# Patient Record
Sex: Male | Born: 1970 | Race: White | Hispanic: No | Marital: Married
Health system: Southern US, Community
[De-identification: ages and names within clinical notes are randomized; demographics above are authoritative.]

## PROBLEM LIST (undated history)

## (undated) DIAGNOSIS — E119 Type 2 diabetes mellitus without complications: Secondary | ICD-10-CM

## (undated) DIAGNOSIS — E785 Hyperlipidemia, unspecified: Secondary | ICD-10-CM

## (undated) DIAGNOSIS — C801 Malignant (primary) neoplasm, unspecified: Secondary | ICD-10-CM

## (undated) DIAGNOSIS — R519 Headache, unspecified: Secondary | ICD-10-CM

## (undated) DIAGNOSIS — A159 Respiratory tuberculosis unspecified: Secondary | ICD-10-CM

## (undated) DIAGNOSIS — K219 Gastro-esophageal reflux disease without esophagitis: Secondary | ICD-10-CM

## (undated) DIAGNOSIS — I10 Essential (primary) hypertension: Secondary | ICD-10-CM

---

## 2009-06-22 ENCOUNTER — Ambulatory Visit: Payer: Self-pay

## 2011-01-24 IMAGING — CR DG CHEST 2V
1 series · 3 of 3 positions shown · non-contrast
Comparison: none

REASON FOR EXAM: cough
COMMENTS:

[Series 1: view not recorded · 0.17mm/px · 3 of 3 slices shown]
[im 1/3]
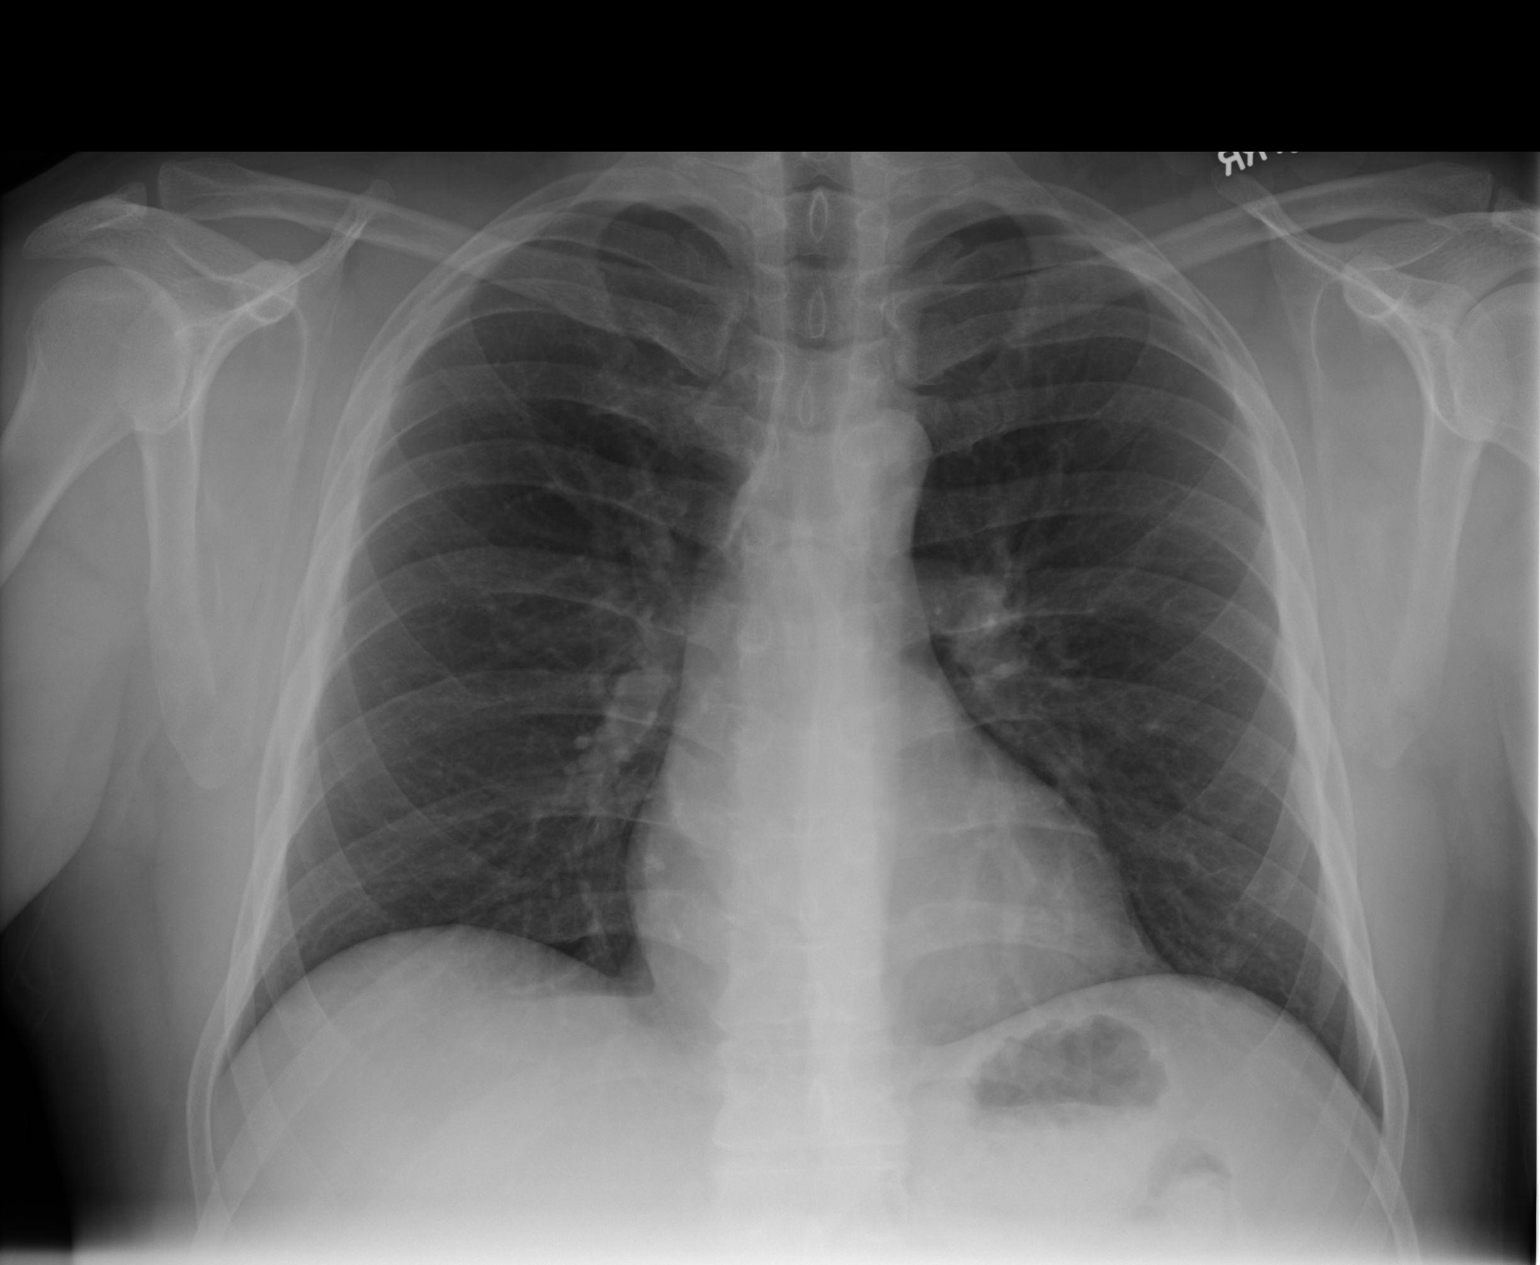
[im 2/3]
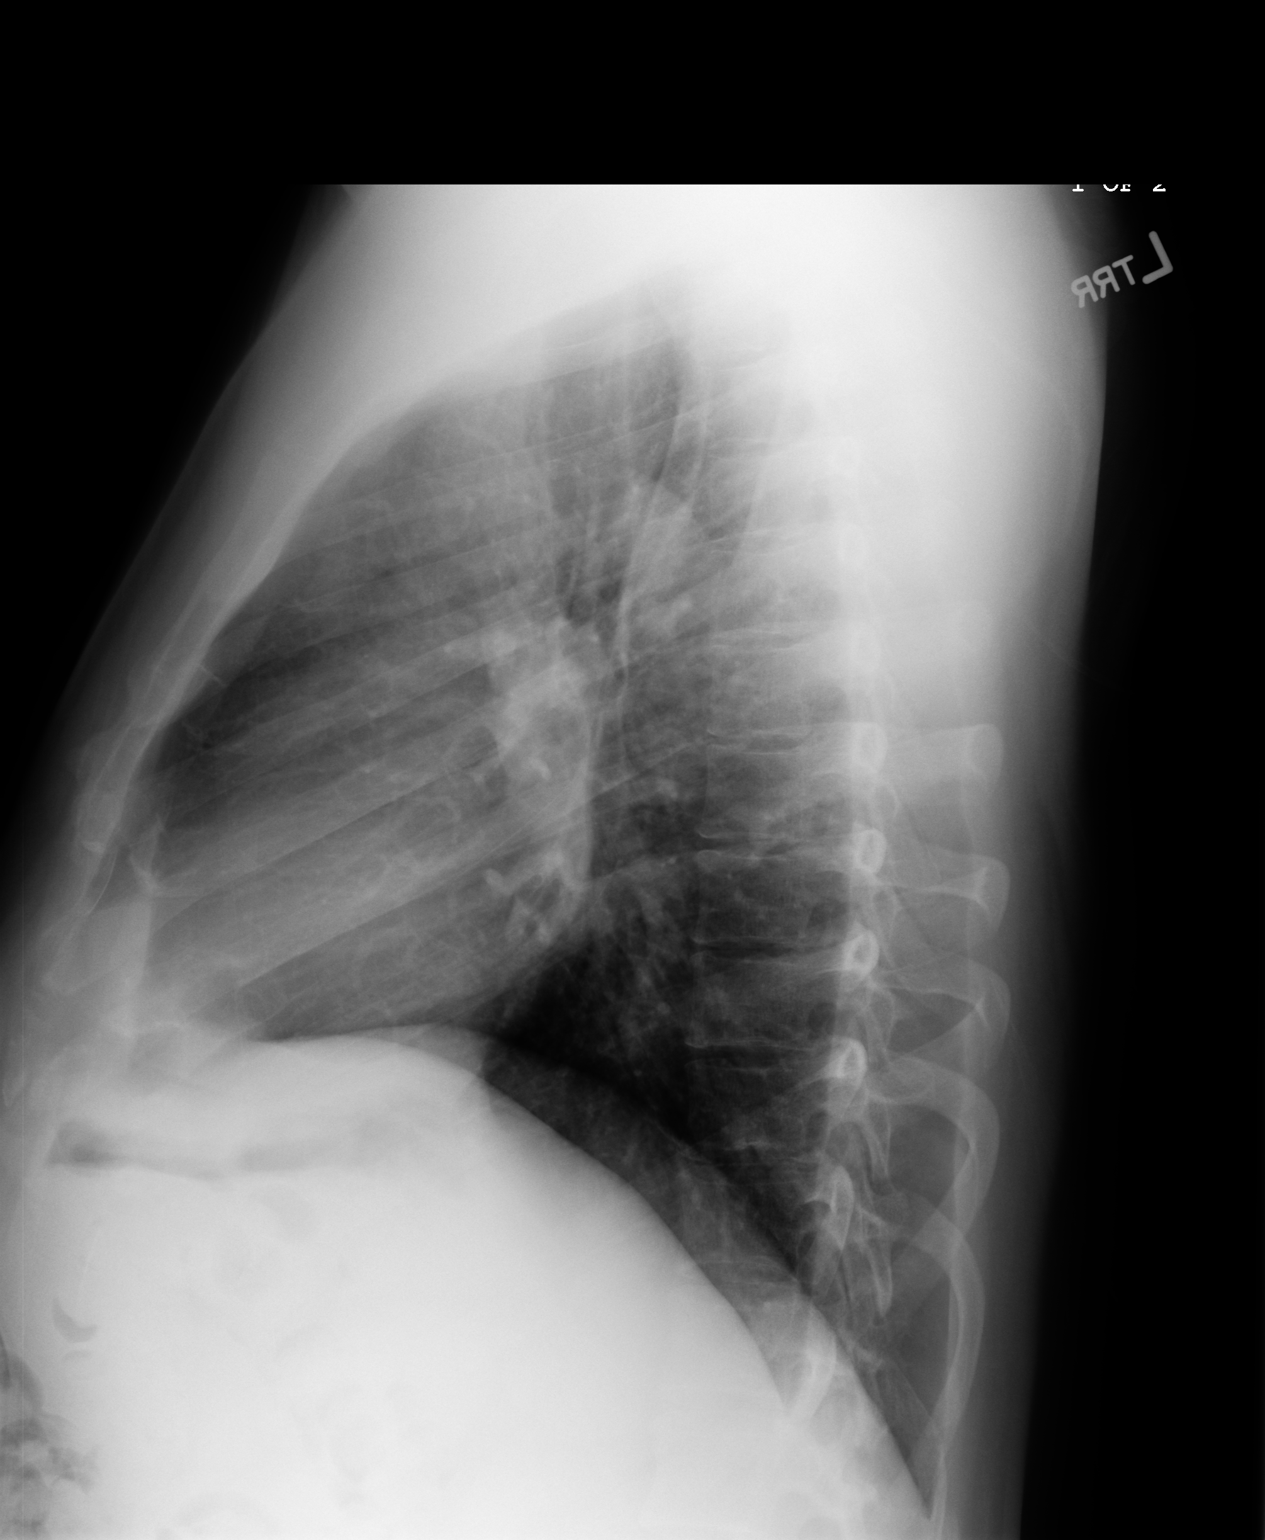
[im 3/3]
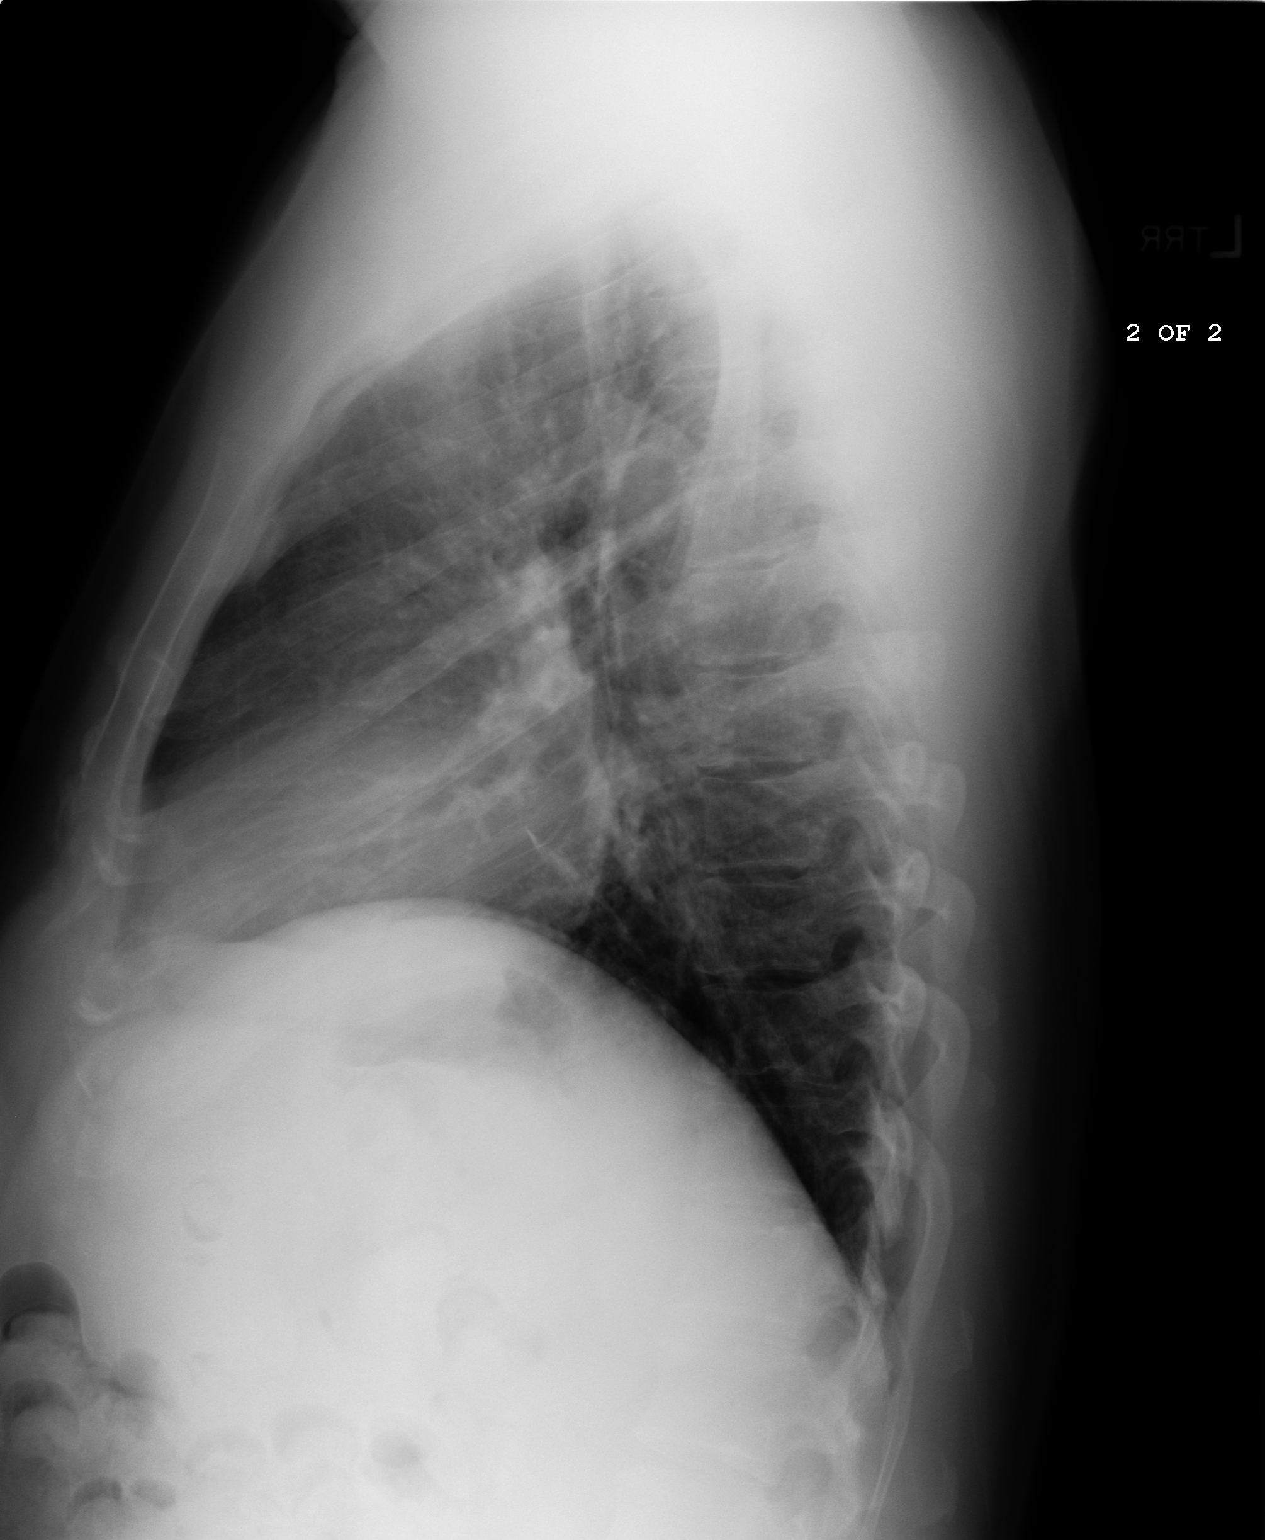

[3 of 3 positions shown; findings below may reference images not displayed]

PROCEDURE:     DXR - DXR CHEST PA (OR AP) AND LATERAL  - June 22, 2009  [DATE]

RESULT:     PA and lateral views of the chest show the lung fields to be
clear. No pneumonia, pneumothorax or pleural effusion is seen. Heart size is
normal. The mediastinal and osseous structures reveal no significant
abnormalities.
IMPRESSION: 1.     No acute changes are identified.

## 2016-03-20 ENCOUNTER — Other Ambulatory Visit: Payer: Self-pay | Admitting: Nurse Practitioner

## 2016-03-20 DIAGNOSIS — R945 Abnormal results of liver function studies: Principal | ICD-10-CM

## 2016-03-20 DIAGNOSIS — R7989 Other specified abnormal findings of blood chemistry: Secondary | ICD-10-CM

## 2018-05-15 HISTORY — PX: MOHS SURGERY: SUR867

## 2020-11-02 ENCOUNTER — Encounter
Admission: RE | Disposition: A | Discharge status: Home / Self Care | Payer: Self-pay | Source: Ambulatory Visit | Attending: Gastroenterology

## 2020-11-02 ENCOUNTER — Ambulatory Visit: Payer: 59 | Admitting: Pediatrics

## 2020-11-02 ENCOUNTER — Ambulatory Visit
Admission: RE | Admit: 2020-11-02 | Discharge: 2020-11-02 | Disposition: A | Discharge status: Home / Self Care | Payer: 59 | Source: Ambulatory Visit | Attending: Gastroenterology | Admitting: Gastroenterology

## 2020-11-02 DIAGNOSIS — I10 Essential (primary) hypertension: Secondary | ICD-10-CM | POA: Insufficient documentation

## 2020-11-02 DIAGNOSIS — Z888 Allergy status to other drugs, medicaments and biological substances status: Secondary | ICD-10-CM | POA: Insufficient documentation

## 2020-11-02 DIAGNOSIS — Z79899 Other long term (current) drug therapy: Secondary | ICD-10-CM | POA: Diagnosis not present

## 2020-11-02 DIAGNOSIS — K640 First degree hemorrhoids: Secondary | ICD-10-CM | POA: Diagnosis not present

## 2020-11-02 DIAGNOSIS — Z1211 Encounter for screening for malignant neoplasm of colon: Secondary | ICD-10-CM | POA: Insufficient documentation

## 2020-11-02 DIAGNOSIS — Z7984 Long term (current) use of oral hypoglycemic drugs: Secondary | ICD-10-CM | POA: Diagnosis not present

## 2020-11-02 DIAGNOSIS — Z886 Allergy status to analgesic agent status: Secondary | ICD-10-CM | POA: Insufficient documentation

## 2020-11-02 HISTORY — DX: Malignant (primary) neoplasm, unspecified: C80.1

## 2020-11-02 HISTORY — DX: Type 2 diabetes mellitus without complications: E11.9

## 2020-11-02 HISTORY — PX: COLONOSCOPY: SHX5424

## 2020-11-02 HISTORY — DX: Headache, unspecified: R51.9

## 2020-11-02 HISTORY — DX: Respiratory tuberculosis unspecified: A15.9

## 2020-11-02 HISTORY — DX: Essential (primary) hypertension: I10

## 2020-11-02 HISTORY — DX: Hyperlipidemia, unspecified: E78.5

## 2020-11-02 HISTORY — DX: Gastro-esophageal reflux disease without esophagitis: K21.9

## 2020-11-02 LAB — GLUCOSE, CAPILLARY: Glucose-Capillary: 171 mg/dL — ABNORMAL HIGH (ref 70–99)

## 2020-11-02 SURGERY — COLONOSCOPY
Anesthesia: General

## 2020-11-02 MED ORDER — SODIUM CHLORIDE 0.9 % IV SOLN
INTRAVENOUS | Status: DC
  Administered 2020-11-02: 20 mL/h via INTRAVENOUS

## 2020-11-02 MED ORDER — PROPOFOL 500 MG/50ML IV EMUL
INTRAVENOUS | Status: DC | PRN
  Administered 2020-11-02: 120 ug/kg/min via INTRAVENOUS

## 2020-11-02 MED ORDER — PROPOFOL 500 MG/50ML IV EMUL
INTRAVENOUS | Status: AC
  Filled 2020-11-02: qty 50

## 2020-11-02 NOTE — Transfer of Care (Signed)
Immediate Anesthesia Transfer of Care Note  Patient: Martin Vasquez  Procedure(s) Performed: COLONOSCOPY  Patient Location: PACU  Anesthesia Type:General  Level of Consciousness: awake and sedated  Airway & Oxygen Therapy: Patient connected to nasal cannula oxygen  Post-op Assessment: Post -op Vital signs reviewed and stable  Post vital signs: Reviewed and stable  Last Vitals:  Vitals Value Taken Time  BP    Temp    Pulse    Resp    SpO2      Last Pain:  Vitals:   11/02/20 0839  TempSrc: Temporal  PainSc: 0-No pain         Complications: No notable events documented.

## 2020-11-02 NOTE — Interval H&P Note (Signed)
History and Physical Interval Note:  11/02/2020 3:52 PM  Martin Vasquez  has presented today for surgery, with the diagnosis of COLON CANCER SCREENING.  The various methods of treatment have been discussed with the patient and family. After consideration of risks, benefits and other options for treatment, the patient has consented to  Procedure(s) with comments: COLONOSCOPY (N/A) - DM as a surgical intervention.  The patient's history has been reviewed, patient examined, no change in status, stable for surgery.  I have reviewed the patient's chart and labs.  Questions were answered to the patient's satisfaction.     Lesly Rubenstein  Ok to proceed with colonoscopy

## 2020-11-02 NOTE — Anesthesia Postprocedure Evaluation (Signed)
Anesthesia Post Note  Patient: Martin Vasquez  Procedure(s) Performed: COLONOSCOPY  Patient location during evaluation: Endoscopy Anesthesia Type: General Level of consciousness: awake and alert Pain management: pain level controlled Vital Signs Assessment: post-procedure vital signs reviewed and stable Respiratory status: spontaneous breathing, nonlabored ventilation, respiratory function stable and patient connected to nasal cannula oxygen Cardiovascular status: blood pressure returned to baseline and stable Postop Assessment: no apparent nausea or vomiting Anesthetic complications: no   No notable events documented.   Last Vitals:  Vitals:   11/02/20 0932 11/02/20 0942  BP: 110/60 122/83  Pulse: 66 62  Resp: 16 16  Temp:    SpO2: 98% 99%    Last Pain:  Vitals:   11/02/20 0942  TempSrc:   PainSc: 0-No pain                 Larey Dresser

## 2020-11-02 NOTE — H&P (Signed)
Outpatient short stay form Pre-procedure 11/02/2020 3:50 PM Raylene Miyamoto MD, MPH  Primary Physician: NP Gauger  Reason for visit:  Screening  History of present illness:   50 y/o gentleman with history of hypertension and no family history of GI malignancies here for screening colonoscopy.   No current facility-administered medications for this encounter.  Current Outpatient Medications:    losartan (COZAAR) 100 MG tablet, Take 100 mg by mouth daily., Disp: , Rfl:    metFORMIN (GLUCOPHAGE) 500 MG tablet, Take 1,000 mg by mouth 2 (two) times daily with a meal., Disp: , Rfl:    omeprazole (PRILOSEC) 20 MG capsule, Take 20 mg by mouth daily., Disp: , Rfl:    rosuvastatin (CRESTOR) 5 MG tablet, Take 5 mg by mouth daily., Disp: , Rfl:   No medications prior to admission.     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    Physical Exam  Gen: Alert, oriented. Appears stated age.  HEENT: PERRLA. Lungs: No respiratory distress CV: RRR Abd: soft, benign, no masses Ext: No edema    Planned procedures: Proceed with colonoscopy. The patient understands the nature of the planned procedure, indications, risks, alternatives and potential complications including but not limited to bleeding, infection, perforation, damage to internal organs and possible oversedation/side effects from anesthesia. The patient agrees and gives consent to proceed.  Please refer to procedure notes for findings, recommendations and patient disposition/instructions.     Raylene Miyamoto MD, MPH Gastroenterology 11/02/2020  3:50 PM

## 2020-11-02 NOTE — Anesthesia Procedure Notes (Signed)
Date/Time: 11/02/2020 9:02 AM Performed by: Vaughan Sine Pre-anesthesia Checklist: Patient identified, Emergency Drugs available, Suction available, Patient being monitored and Timeout performed Patient Re-evaluated:Patient Re-evaluated prior to induction Oxygen Delivery Method: Nasal cannula Preoxygenation: Pre-oxygenation with 100% oxygen Induction Type: IV induction Placement Confirmation: positive ETCO2 and CO2 detector

## 2020-11-02 NOTE — Op Note (Signed)
[facility] Gastroenterology Patient Name: Martin Vasquez Procedure Date: 11/02/2020 8:52 AM MRN: [id number] Account #: [id number] Date of Birth: 04-30-1971 Admit Type: Outpatient Age: 50 Room: [facility] ENDO ROOM 1 Gender: Male Note Status: Finalized Procedure:             Colonoscopy Indications:           Screening for colorectal malignant neoplasm Providers:             Andrey Farmer MD, MD Referring MD:          No Local Md, MD (Referring MD) Medicines:             Monitored Anesthesia Care Complications:         No immediate complications. Procedure:             Pre-Anesthesia Assessment:                        - Prior to the procedure, a History and Physical was                         performed, and patient medications and allergies were                         reviewed. The patient is competent. The risks and                         benefits of the procedure and the sedation options and                         risks were discussed with the patient. All questions                         were answered and informed consent was obtained.                         Patient identification and proposed procedure were                         verified by the physician, the nurse, the anesthetist                         and the technician in the endoscopy suite. Mental                         Status Examination: alert and oriented. Airway                         Examination: normal oropharyngeal airway and neck                         mobility. Respiratory Examination: clear to                         auscultation. CV Examination: normal. Prophylactic                         Antibiotics: The patient does not require prophylactic  antibiotics. Prior Anticoagulants: The patient has                         taken no previous anticoagulant or antiplatelet                         agents. ASA Grade Assessment: I - A normal, healthy                          patient. After reviewing the risks and benefits, the                         patient was deemed in satisfactory condition to                         undergo the procedure. The anesthesia plan was to use                         monitored anesthesia care (MAC). Immediately prior to                         administration of medications, the patient was                         re-assessed for adequacy to receive sedatives. The                         heart rate, respiratory rate, oxygen saturations,                         blood pressure, adequacy of pulmonary ventilation, and                         response to care were monitored throughout the                         procedure. The physical status of the patient was                         re-assessed after the procedure.                        After obtaining informed consent, the colonoscope was                         passed under direct vision. Throughout the procedure,                         the patient's blood pressure, pulse, and oxygen                         saturations were monitored continuously. The                         Colonoscope was introduced through the anus and                         advanced to the the terminal ileum. The colonoscopy  was performed without difficulty. The patient                         tolerated the procedure well. The quality of the bowel                         preparation was excellent. Findings:      The perianal and digital rectal examinations were normal.      Non-bleeding internal hemorrhoids were found during retroflexion. The       hemorrhoids were Grade I (internal hemorrhoids that do not prolapse).      The exam was otherwise without abnormality on direct and retroflexion       views. Impression:            - Non-bleeding internal hemorrhoids.                        - The examination was otherwise normal on direct and                         retroflexion views.                         - No specimens collected. Recommendation:        - Discharge patient to home.                        - Resume previous diet.                        - Continue present medications.                        - Repeat colonoscopy in 10 years for screening                         purposes.                        - Return to referring physician as previously                         scheduled. Procedure Code(s):     --- Professional ---                        S8546, Colorectal cancer screening; colonoscopy on                         individual not meeting criteria for high risk Diagnosis Code(s):     --- Professional ---                        Z12.11, Encounter for screening for malignant neoplasm                         of colon                        K64.0, First degree hemorrhoids CPT copyright 2019 [employer]. All rights reserved. The codes documented in this report are preliminary and upon coder review may  be revised to meet current  compliance requirements. Andrey Farmer MD, MD 11/02/2020 9:18:49 AM Number of Addenda: 0 Note Initiated On: 11/02/2020 8:52 AM Scope Withdrawal Time: 0 hours 10 minutes 56 seconds  Total Procedure Duration: 0 hours 16 minutes 9 seconds  Estimated Blood Loss:  Estimated blood loss: none.      [facility]

## 2020-11-02 NOTE — Anesthesia Preprocedure Evaluation (Addendum)
Anesthesia Evaluation  Patient identified by MRN, date of birth, ID band Patient awake    Reviewed: Allergy & Precautions, NPO status , Patient's Chart, lab work & pertinent test results  History of Anesthesia Complications Negative for: history of anesthetic complications  Airway Mallampati: II  TM Distance: >3 FB Neck ROM: Full    Dental no notable dental hx.    Pulmonary neg pulmonary ROS,    Pulmonary exam normal breath sounds clear to auscultation       Cardiovascular Exercise Tolerance: Good hypertension, Normal cardiovascular exam Rhythm:Regular Rate:Normal    METs >4; denies CP or SOB w/ exertion   Neuro/Psych  Headaches, negative psych ROS   GI/Hepatic Neg liver ROS, GERD  ,  Endo/Other  diabetes  Renal/GU negative Renal ROS  negative genitourinary   Musculoskeletal negative musculoskeletal ROS (+)   Abdominal (+) - obese (BMI 27),   Peds negative pediatric ROS (+)  Hematology  Skin CA s/p Mohs surgery 2020   Anesthesia Other Findings   Reproductive/Obstetrics negative OB ROS                           Anesthesia Physical Anesthesia Plan  ASA: 2  Anesthesia Plan: General   Post-op Pain Management:    Induction: Intravenous  PONV Risk Score and Plan: Propofol infusion and TIVA  Airway Management Planned: Natural Airway and Nasal Cannula  Additional Equipment: None  Intra-op Plan:   Post-operative Plan:   Informed Consent: I have reviewed the patients History and Physical, chart, labs and discussed the procedure including the risks, benefits and alternatives for the proposed anesthesia with the patient or authorized representative who has indicated his/her understanding and acceptance.     Dental advisory given  Plan Discussed with: Anesthesiologist and CRNA  Anesthesia Plan Comments:        Anesthesia Quick Evaluation

## 2020-11-03 ENCOUNTER — Encounter: Payer: Self-pay | Admitting: Gastroenterology
